# Patient Record
Sex: Female | Born: 1990 | Race: White | Hispanic: No | Marital: Single | Smoking: Never smoker
Health system: Southern US, Community
[De-identification: ages and names within clinical notes are randomized; demographics above are authoritative.]

## PROBLEM LIST (undated history)

## (undated) DIAGNOSIS — L709 Acne, unspecified: Secondary | ICD-10-CM

## (undated) DIAGNOSIS — Z8659 Personal history of other mental and behavioral disorders: Secondary | ICD-10-CM

## (undated) HISTORY — DX: Personal history of other mental and behavioral disorders: Z86.59

## (undated) HISTORY — DX: Acne, unspecified: L70.9

---
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
## 2012-06-23 ENCOUNTER — Ambulatory Visit (INDEPENDENT_AMBULATORY_CARE_PROVIDER_SITE_OTHER): Payer: BC Managed Care – PPO | Admitting: Internal Medicine

## 2012-06-23 ENCOUNTER — Encounter: Payer: Self-pay | Admitting: Internal Medicine

## 2012-06-23 VITALS — BP 110/72 | HR 69 | Temp 97.9°F | Resp 16 | Wt 126.5 lb

## 2012-06-23 DIAGNOSIS — J101 Influenza due to other identified influenza virus with other respiratory manifestations: Secondary | ICD-10-CM

## 2012-06-23 DIAGNOSIS — Z23 Encounter for immunization: Secondary | ICD-10-CM

## 2012-06-23 DIAGNOSIS — J111 Influenza due to unidentified influenza virus with other respiratory manifestations: Secondary | ICD-10-CM

## 2012-06-23 MED ORDER — CITALOPRAM HYDROBROMIDE 10 MG PO TABS: 10.0000 mg | ORAL_TABLET | Freq: Every day | ORAL | Status: DC

## 2012-06-23 MED ORDER — CIPROFLOXACIN HCL 250 MG PO TABS: 250.0000 mg | ORAL_TABLET | Freq: Two times a day (BID) | ORAL | Status: AC

## 2012-06-23 MED ORDER — PNEUMOCOCCAL VAC POLYVALENT 25 MCG/0.5ML IJ INJ: 0.5000 mL | INJECTION | Freq: Once | INTRAMUSCULAR | Status: AC

## 2012-06-23 MED ORDER — ALPRAZOLAM 0.5 MG PO TABS: 0.5000 mg | ORAL_TABLET | Freq: Every evening | ORAL | Status: DC | PRN

## 2012-06-23 NOTE — Progress Notes (Signed)
Patient ID: AIBHLINN KALMAR, female   DOB: 10-02-1990, 22 y.o.   MRN: [id number]  Patient Active Problem List  Diagnosis  . History of eating disorder  . Acne  . Generalized anxiety disorder  . Attention and concentration deficit  . History of nephrolithiasis  . Need for pneumococcal vaccine  . Influenza A with respiratory manifestations    Subjective:  CC:   Chief Complaint  Patient presents with  . Follow-up    Med refills     HPI:   Brianna Morales a 22 y.o. female who presents with a Request for  Vaccinations prior to going abroad.  She is travelling to [place] on Jan 30 and staying until  May 8 , [place] as part of her [employer] study program.  She was diagnosed with Influenza A  over Christmas, followed by Strep throat .  Feels fine today.  Has not had the flu or pneumonia vaccine.    Past Medical History  Diagnosis Date  . History of eating disorder   . Acne     History reviewed. No pertinent past surgical history.     . pneumococcal 23 valent vaccine  0.5 mL Intramuscular Once     The following portions of the patient's history were reviewed and updated as appropriate: Allergies, current medications, and problem list.    Review of Systems:   12 Pt  review of systems was negative except those addressed in the HPI,     History   Social History  . Marital Status: Single    Spouse Name: N/A    Number of Children: N/A  . Years of Education: N/A   Occupational History  . Not on file.   Social History Main Topics  . Smoking status: Never Smoker   . Smokeless tobacco: Never Used  . Alcohol Use: No  . Drug Use: No  . Sexually Active: Not on file   Other Topics Concern  . Not on file   Social History Narrative  . No narrative on file    Objective:  BP 110/72  Pulse 69  Temp 97.9 F (36.6 C) (Oral)  Resp 16  Wt 126 lb 8 oz (57.38 kg)  SpO2 98%  LMP 06/09/2012  General appearance: alert, cooperative and appears stated  age Ears: normal TM's and external ear canals both ears Throat: lips, mucosa, and tongue normal; teeth and gums normal Neck: no adenopathy, no carotid bruit, supple, symmetrical, trachea midline and thyroid not enlarged, symmetric, no tenderness/mass/nodules Back: symmetric, no curvature. ROM normal. No CVA tenderness. Lungs: clear to auscultation bilaterally Heart: regular rate and rhythm, S1, S2 normal, no murmur, click, rub or gallop Abdomen: soft, non-tender; bowel sounds normal; no masses,  no organomegaly Pulses: 2+ and symmetric Skin: Skin color, texture, turgor normal. No rashes or lesions Lymph nodes: Cervical, supraclavicular, and axillary nodes normal.  Assessment and Plan:  Need for pneumococcal vaccine Pneumovax given,   Influenza A with respiratory manifestations Influenza vaccine given today.   Updated Medication List Outpatient Encounter Prescriptions as of 06/23/2012  Medication Sig Dispense Refill  . ALPRAZolam (XANAX) 0.5 MG tablet Take 1 tablet (0.5 mg total) by mouth at bedtime as needed for sleep.  30 tablet  0  . citalopram (CELEXA) 10 MG tablet Take 1 tablet (10 mg total) by mouth daily.  30 tablet  0  . [DISCONTINUED] ALPRAZolam (XANAX) 0.5 MG tablet take 1 tablet by mouth once daily if needed for anxiety  20 tablet  0  . [  DISCONTINUED] ALPRAZolam (XANAX) 0.5 MG tablet Take 1 tablet (0.5 mg total) by mouth at bedtime as needed for sleep.  90 tablet  0  . [DISCONTINUED] citalopram (CELEXA) 10 MG tablet Take 1 tablet (10 mg total) by mouth daily.  30 tablet  5  . [DISCONTINUED] citalopram (CELEXA) 10 MG tablet Take 1 tablet (10 mg total) by mouth daily.  90 tablet  1  . ciprofloxacin (CIPRO) 250 MG tablet Take 1 tablet (250 mg total) by mouth 2 (two) times daily.  6 tablet  0   Facility-Administered Encounter Medications as of 06/23/2012  Medication Dose Route Frequency Provider Last Rate Last Dose  . pneumococcal 23 valent vaccine (PNU-IMMUNE) injection 0.5 mL   0.5 mL Intramuscular Once Sherlene Shams, MD         No orders of the defined types were placed in this encounter.    No Follow-up on file.

## 2012-06-25 ENCOUNTER — Encounter: Payer: Self-pay | Admitting: Internal Medicine

## 2012-06-25 DIAGNOSIS — Z23 Encounter for immunization: Secondary | ICD-10-CM | POA: Insufficient documentation

## 2012-06-25 DIAGNOSIS — J101 Influenza due to other identified influenza virus with other respiratory manifestations: Secondary | ICD-10-CM | POA: Insufficient documentation

## 2012-06-25 NOTE — Assessment & Plan Note (Signed)
Pneumovax given,

## 2012-06-25 NOTE — Assessment & Plan Note (Signed)
-   Influenza vaccine given today 

## 2012-10-16 ENCOUNTER — Other Ambulatory Visit: Payer: Self-pay | Admitting: Internal Medicine

## 2012-10-17 NOTE — Telephone Encounter (Signed)
Refill one time only   Will need OV prior to additional refills on alprazolam

## 2012-10-17 NOTE — Telephone Encounter (Signed)
Left message for patient to call office. Refill called in as requested.

## 2012-12-01 ENCOUNTER — Ambulatory Visit (INDEPENDENT_AMBULATORY_CARE_PROVIDER_SITE_OTHER): Payer: Self-pay | Admitting: Internal Medicine

## 2012-12-01 ENCOUNTER — Encounter: Payer: Self-pay | Admitting: Internal Medicine

## 2012-12-01 VITALS — BP 106/64 | HR 59 | Temp 97.4°F | Resp 16 | Wt 133.8 lb

## 2012-12-01 DIAGNOSIS — Z79899 Other long term (current) drug therapy: Secondary | ICD-10-CM

## 2012-12-01 DIAGNOSIS — F411 Generalized anxiety disorder: Secondary | ICD-10-CM

## 2012-12-01 DIAGNOSIS — R5383 Other fatigue: Secondary | ICD-10-CM

## 2012-12-01 DIAGNOSIS — R41840 Attention and concentration deficit: Secondary | ICD-10-CM

## 2012-12-01 DIAGNOSIS — Z309 Encounter for contraceptive management, unspecified: Secondary | ICD-10-CM

## 2012-12-01 DIAGNOSIS — R5381 Other malaise: Secondary | ICD-10-CM

## 2012-12-01 LAB — COMPREHENSIVE METABOLIC PANEL
ALT: 16 U/L (ref 0–35)
AST: 20 U/L (ref 0–37)
Alkaline Phosphatase: 58 U/L (ref 39–117)
CO2: 24 mEq/L (ref 19–32)
Sodium: 136 mEq/L (ref 135–145)
Total Bilirubin: 0.5 mg/dL (ref 0.3–1.2)
Total Protein: 7.2 g/dL (ref 6.0–8.3)

## 2012-12-01 LAB — CBC WITH DIFFERENTIAL/PLATELET
Basophils Absolute: 0 10*3/uL (ref 0.0–0.1)
Basophils Relative: 1 % (ref 0–1)
Eosinophils Absolute: 0.2 10*3/uL (ref 0.0–0.7)
Eosinophils Relative: 2 % (ref 0–5)
HCT: 38.4 % (ref 36.0–46.0)
MCH: 28.2 pg (ref 26.0–34.0)
MCHC: 34.9 g/dL (ref 30.0–36.0)
MCV: 80.7 fL (ref 78.0–100.0)
Monocytes Absolute: 1 10*3/uL (ref 0.1–1.0)
Platelets: 286 10*3/uL (ref 150–400)
RDW: 14.4 % (ref 11.5–15.5)
WBC: 8.1 10*3/uL (ref 4.0–10.5)

## 2012-12-01 LAB — FERRITIN: Ferritin: 16 ng/mL (ref 10–291)

## 2012-12-01 LAB — TSH: TSH: 0.698 u[IU]/mL (ref 0.350–4.500)

## 2012-12-01 MED ORDER — CITALOPRAM HYDROBROMIDE 20 MG PO TABS: 20.0000 mg | ORAL_TABLET | Freq: Every day | ORAL | Status: DC

## 2012-12-01 MED ORDER — ALPRAZOLAM 1 MG PO TABS: 1.0000 mg | ORAL_TABLET | Freq: Every day | ORAL | Status: DC | PRN

## 2012-12-01 MED ORDER — CITALOPRAM HYDROBROMIDE 20 MG PO TABS: 10.0000 mg | ORAL_TABLET | Freq: Every day | ORAL | Status: DC

## 2012-12-01 NOTE — Patient Instructions (Addendum)
Referral to Dr Tamsen Roers for evaluation for ADD and mood cycling   I have increased your citalopram to 20 mg daily      Take no more than 1 mg of alprazolam as needed for panic   Birth control!!!

## 2012-12-01 NOTE — Progress Notes (Signed)
Patient ID: Brianna Morales, female   DOB: [birth year], 22 y.o.   MRN: [id number]  Patient Active Problem List   Diagnosis Date Noted  . Contraception management 12/02/2012  . Need for pneumococcal vaccine 06/25/2012  . Influenza A with respiratory manifestations 06/25/2012  . History of nephrolithiasis 12/19/2011  . Attention and concentration deficit 09/12/2011  . Generalized anxiety disorder 08/13/2011  . History of eating disorder   . Acne     Subjective:  CC:   Chief Complaint  Patient presents with  . Follow-up    update medications    HPI:   Brianna Morales a 22 y.o. female who presents for follow up on chronic conditions including anxiety with occasional panic episodes,  Irregular menstrual periods,  And trouble concentrating. 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  No trauma or incidents .    Has talked to several of her friends about her anxeity and trouble concentrating and now wants to have medications adjusted and evaluation for ADD  States that she did not take her citalopram for several weeks because she forgot, and developed anhedonia and tearfulness, so she has resumed it with good results.  She continues to have occasional "down" days lasting 2 or 3 days when she finds she just wants to lie in bed all day.   Wants to increase dose .  Uses the alprazolam for panic  Has found that if she uses it to control mild anxiety she feels lethargic.   Wants to start oral contraceptives to regulate her periods.    Past Medical History  Diagnosis Date  . History of eating disorder   . Acne     History reviewed. No pertinent past surgical history.  . pneumococcal 23 valent vaccine  0.5 mL Intramuscular Once     The following portions of the patient's history were reviewed and updated as appropriate: Allergies, current medications, and problem list.    Review of Systems:   12 Pt  review of systems  was negative except those addressed in the HPI,     History   Social History  . Marital Status: Single    Spouse Name: N/A    Number of Children: N/A  . Years of Education: N/A   Occupational History  . Not on file.   Social History Main Topics  . Smoking status: Never Smoker   . Smokeless tobacco: Never Used  . Alcohol Use: No  . Drug Use: No  . Sexually Active: Not on file   Other Topics Concern  . Not on file   Social History Narrative  . No narrative on file    Objective:  BP 106/64  Pulse 59  Temp(Src) 97.4 F (36.3 C) (Oral)  Resp 16  Wt 133 lb 12 oz (60.669 kg)  BMI 22.95 kg/m2  SpO2 98%  LMP 11/07/2012  General appearance: alert, cooperative and appears stated age Ears: normal TM's and external ear canals both ears Throat: lips, mucosa, and tongue normal; teeth and gums normal Neck: no adenopathy, no carotid bruit, supple, symmetrical, trachea midline and thyroid not enlarged, symmetric, no tenderness/mass/nodules Back: symmetric, no curvature. ROM normal. No CVA tenderness. Lungs: clear to auscultation bilaterally Heart: regular rate and rhythm, S1, S2 normal, no murmur, click, rub or gallop Abdomen: soft, non-tender; bowel sounds normal; no masses,  no organomegaly Pulses: 2+ and symmetric Skin: Skin color, texture, turgor normal. No rashes or lesions  Lymph nodes: Cervical, supraclavicular, and axillary nodes normal.  Assessment and Plan:  Attention and concentration deficit Referral to Dr. Maryruth Bun for formal evaluation   Generalized anxiety disorder Discussed increasing citalpram to 20 mg daiy.  Warned her to avoid excessive reliance on sedating medications to manage her anxiety and recommended regular exercise and meditation.   Contraception management Education given regarding options for contraception, including oral contraceptives.  UPT and CMET ordered prior to initiation    Updated Medication List Outpatient Encounter Prescriptions as  of 12/01/2012  Medication Sig Dispense Refill  . ALPRAZolam (XANAX) 1 MG tablet Take 1 tablet (1 mg total) by mouth daily as needed for anxiety.  30 tablet  1  . citalopram (CELEXA) 20 MG tablet Take 1 tablet (20 mg total) by mouth daily.  30 tablet  1  . [DISCONTINUED] ALPRAZolam (XANAX) 0.5 MG tablet take 1 tablet by mouth at bedtime if needed for sleep  90 tablet  0  . [DISCONTINUED] citalopram (CELEXA) 10 MG tablet Take 1 tablet (10 mg total) by mouth daily.  30 tablet  0  . [DISCONTINUED] citalopram (CELEXA) 20 MG tablet Take 0.5 tablets (10 mg total) by mouth daily.  30 tablet  1  . ciprofloxacin (CIPRO) 250 MG tablet Take 1 tablet (250 mg total) by mouth 2 (two) times daily.  6 tablet  0  . norgestrel-ethinyl estradiol (OGESTREL) 0.5-50 MG-MCG tablet Take 1 tablet by mouth daily.  1 Package  11   Facility-Administered Encounter Medications as of 12/01/2012  Medication Dose Route Frequency Provider Last Rate Last Dose  . pneumococcal 23 valent vaccine (PNU-IMMUNE) injection 0.5 mL  0.5 mL Intramuscular Once Sherlene Shams, MD         Orders Placed This Encounter  Procedures  . TSH  . Comp Met (CMET)  . CBC w/Diff  . Iron Binding Cap (TIBC)  . Ferritin  . POCT urine pregnancy    No Follow-up on file.

## 2012-12-02 ENCOUNTER — Encounter: Payer: Self-pay | Admitting: Internal Medicine

## 2012-12-02 DIAGNOSIS — Z309 Encounter for contraceptive management, unspecified: Secondary | ICD-10-CM | POA: Insufficient documentation

## 2012-12-02 MED ORDER — NORGESTREL-ETHINYL ESTRADIOL 0.5-50 MG-MCG PO TABS: 1.0000 | ORAL_TABLET | Freq: Every day | ORAL | Status: DC

## 2012-12-02 NOTE — Assessment & Plan Note (Signed)
Education given regarding options for contraception, including oral contraceptives.  UPT and CMET ordered prior to initiation

## 2012-12-02 NOTE — Assessment & Plan Note (Signed)
Referral to Dr. Maryruth Bun for formal evaluation

## 2012-12-02 NOTE — Assessment & Plan Note (Signed)
Discussed increasing citalpram to 20 mg daiy.  Warned her to avoid excessive reliance on sedating medications to manage her anxiety and recommended regular exercise and meditation.

## 2012-12-04 ENCOUNTER — Encounter: Payer: Self-pay | Admitting: *Deleted

## 2012-12-12 ENCOUNTER — Telehealth: Payer: Self-pay | Admitting: Internal Medicine

## 2012-12-12 DIAGNOSIS — R41840 Attention and concentration deficit: Secondary | ICD-10-CM

## 2012-12-12 DIAGNOSIS — F419 Anxiety disorder, unspecified: Secondary | ICD-10-CM

## 2012-12-12 NOTE — Telephone Encounter (Signed)
Patient called wanting to know about a referral to a psychologist. Please advise.

## 2012-12-12 NOTE — Telephone Encounter (Signed)
Referral is in process to Dr. Maryruth Bun as requested

## 2012-12-13 ENCOUNTER — Telehealth: Payer: Self-pay | Admitting: Internal Medicine

## 2012-12-13 NOTE — Telephone Encounter (Signed)
Pt would like a call back to know which Birth Control she would be perscribed. She received her blood work back that she is not pregnant and wanted to get the birth control. She uses [facility] in [place] ??

## 2012-12-13 NOTE — Telephone Encounter (Signed)
Pt notified referral in process

## 2012-12-14 ENCOUNTER — Telehealth: Payer: Self-pay | Admitting: Internal Medicine

## 2012-12-14 NOTE — Telephone Encounter (Signed)
script was sent electronically on 12/02/12 and is at pharmacy notified patient.

## 2012-12-14 NOTE — Telephone Encounter (Signed)
Notified patient script at pharmacy. 

## 2012-12-14 NOTE — Telephone Encounter (Signed)
Patient called back checking on a script for birth control. Please advise

## 2013-01-11 ENCOUNTER — Telehealth: Payer: Self-pay | Admitting: *Deleted

## 2013-01-11 NOTE — Telephone Encounter (Signed)
If it looks like a pimple, try treating it like a pimple or make an appt

## 2013-01-11 NOTE — Telephone Encounter (Signed)
Patient stated since starting new Birth control the OGESTREL she is having break out of a rash on her face an back that look like pimples please advise,

## 2013-01-12 NOTE — Telephone Encounter (Signed)
Tried to call patient voicemail has not been set up can't leave message.

## 2013-01-17 NOTE — Telephone Encounter (Signed)
Patient notified as instructed. 

## 2013-02-05 ENCOUNTER — Other Ambulatory Visit: Payer: Self-pay | Admitting: Internal Medicine

## 2013-02-07 NOTE — Telephone Encounter (Signed)
Notified pt that Rx ready for pick up and need to sign controlled substance agreement, verbalized understanding.

## 2013-02-27 ENCOUNTER — Encounter: Payer: Self-pay | Admitting: *Deleted

## 2013-03-01 ENCOUNTER — Encounter: Payer: Self-pay | Admitting: Internal Medicine

## 2013-03-01 ENCOUNTER — Ambulatory Visit (INDEPENDENT_AMBULATORY_CARE_PROVIDER_SITE_OTHER): Payer: Self-pay | Admitting: Internal Medicine

## 2013-03-01 VITALS — BP 100/60 | HR 54 | Temp 98.4°F | Resp 14 | Ht 64.0 in | Wt 135.8 lb

## 2013-03-01 DIAGNOSIS — F411 Generalized anxiety disorder: Secondary | ICD-10-CM

## 2013-03-01 DIAGNOSIS — Z309 Encounter for contraceptive management, unspecified: Secondary | ICD-10-CM

## 2013-03-01 DIAGNOSIS — R41840 Attention and concentration deficit: Secondary | ICD-10-CM

## 2013-03-01 MED ORDER — IBUPROFEN 800 MG PO TABS: 800.0000 mg | ORAL_TABLET | Freq: Three times a day (TID) | ORAL | Status: AC | PRN

## 2013-03-01 MED ORDER — NORETHINDRONE ACET-ETHINYL EST 1-20 MG-MCG PO TABS: 1.0000 | ORAL_TABLET | Freq: Every day | ORAL | Status: DC

## 2013-03-01 NOTE — Progress Notes (Signed)
Patient ID: Brianna Morales, female   DOB: 12-03-1990, 22 y.o.   MRN: [id number]  Patient Active Problem List   Diagnosis Date Noted  . Contraception management 12/02/2012  . Need for pneumococcal vaccine 06/25/2012  . Influenza A with respiratory manifestations 06/25/2012  . History of nephrolithiasis 12/19/2011  . Attention and concentration deficit 09/12/2011  . Generalized anxiety disorder 08/13/2011  . History of eating disorder   . Acne     Subjective:  CC:   Chief Complaint  Patient presents with  . Follow-up    discuss birthcontrol    HPI:   [name] a 22 y.o. female who presents for follow up on chronic conditions including anxiety with occasional panic episodes,  Irregular menstrual periods,  And trouble concentrating.  At last visit had resumed celexa and was using alprazolam prn .  She was referred to Dr. Maryruth Bun due to concurrent symptoms of ADD for diagnostic testing.  Her citalopram was  increased to 30 mg qd by Dr Maryruth Bun august 4th and she was referred to Dr Dionne Milo for formal testing for ADD.Her appt is today .  She was advised to stop using marijuana. Which was using recreational he, to avoid psychological dependence.  She states that she has had no trouble staining from use of marijuana. She does not use other illicits.  She is requesting a change in hormonal contraceptives because she has noticed that her admitting increases the week after her period in a week during her period.   she uses [employer] ,  showers twice daily  when she is physically active.   also low-fat diet. Chest is she also has moderately painful menstrual cramps that last 3-4 days prior to her menstrual period occurred again at the onset of menses. She has not tried taking any medications on regular basis but has had relief with ibuprofen  and Midol      Past Medical History  Diagnosis Date  . History of eating disorder   . Acne     History reviewed. No  pertinent past surgical history.  . pneumococcal 23 valent vaccine  0.5 mL Intramuscular Once     The following portions of the patient's history were reviewed and updated as appropriate: Allergies, current medications, and problem list.    Review of Systems:  Patient denies headache, fevers, malaise, unintentional weight loss, skin rash, eye pain, sinus congestion and sinus pain, sore throat, dysphagia,  hemoptysis , cough, dyspnea, wheezing, chest pain, palpitations, orthopnea, edema, abdominal pain, nausea, melena, diarrhea, constipation, flank pain, dysuria, hematuria, urinary  Frequency, nocturia, numbness, tingling, seizures,  Focal weakness, Loss of consciousness,  Tremor, insomnia, depression, anxiety, and suicidal ideation.     History   Social History  . Marital Status: Single    Spouse Name: N/A    Number of Children: N/A  . Years of Education: N/A   Occupational History  . Not on file.   Social History Main Topics  . Smoking status: Never Smoker   . Smokeless tobacco: Never Used  . Alcohol Use: No     Comment: has quit since August 2014  . Drug Use: Yes    Special: Marijuana  . Sexual Activity: Yes   Other Topics Concern  . Not on file   Social History Narrative  . No narrative on file    Objective:  Filed Vitals:   03/01/13 0915  BP: 100/60  Pulse: 54  Temp: 98.4 F (36.9 C)  Resp:  14     General appearance: alert, cooperative and appears stated age Neck: no adenopathy, no carotid bruit, supple, symmetrical, trachea midline and thyroid not enlarged, symmetric, no tenderness/mass/nodules Back: symmetric, no curvature. ROM normal. No CVA tenderness. Lungs: clear to auscultation bilaterally Heart: regular rate and rhythm, S1, S2 normal, no murmur, click, rub or gallop Abdomen: soft, non-tender; bowel sounds normal; no masses,  no organomegaly Pulses: 2+ and symmetric Skin: Skin color, texture, turgor normal. No rashes or lesions Lymph nodes:  Cervical, supraclavicular, and axillary nodes normal. Psych: calm. makes good eye contact, affect normal. Speech is nonpressured . attention span appears normal.  Assessment and Plan:  Attention and concentration deficit Symptoms reported by patient. She has been referred for formal testing to Dr. Dionne Milo by Dr. Maryruth Bun  Contraception management Per patient request we have changed her hormal contraception.  Her new prescription has a  lower estrogen/higher norethindrone content.  Generalized anxiety disorder  Improved with increased dose of citalopram. Follow up with Dr. Maryruth Bun as needed.   Updated Medication List Outpatient Encounter Prescriptions as of 03/01/2013  Medication Sig Dispense Refill  . ALPRAZolam (XANAX) 0.5 MG tablet take 1 tablet by mouth at bedtime if needed for sleep  90 tablet  0  . ALPRAZolam (XANAX) 1 MG tablet Take 1 tablet (1 mg total) by mouth daily as needed for anxiety.  30 tablet  1  . citalopram (CELEXA) 20 MG tablet Take 30 mg by mouth daily.      . [DISCONTINUED] citalopram (CELEXA) 20 MG tablet Take 1 tablet (20 mg total) by mouth daily.  30 tablet  1  . [DISCONTINUED] norgestrel-ethinyl estradiol (OGESTREL) 0.5-50 MG-MCG tablet Take 1 tablet by mouth daily.  1 Package  11  . ciprofloxacin (CIPRO) 250 MG tablet Take 1 tablet (250 mg total) by mouth 2 (two) times daily.  6 tablet  0  . ibuprofen (ADVIL,MOTRIN) 800 MG tablet Take 1 tablet (800 mg total) by mouth every 8 (eight) hours as needed for pain.  30 tablet  0  . norethindrone-ethinyl estradiol (MICROGESTIN,JUNEL,LOESTRIN) 1-20 MG-MCG tablet Take 1 tablet by mouth daily.  1 Package  11   Facility-Administered Encounter Medications as of 03/01/2013  Medication Dose Route Frequency Provider Last Rate Last Dose  . pneumococcal 23 valent vaccine (PNU-IMMUNE) injection 0.5 mL  0.5 mL Intramuscular Once Sherlene Shams, MD         No orders of the defined types were placed in this encounter.    No  Follow-up on file.

## 2013-03-01 NOTE — Patient Instructions (Addendum)
I am changing your birth control to a combination that has less estrogen and more norethindrone to see if your acne breakouts impmorve  I sent an rx for 800 mg ibuprofen to pharmacy to use for bac cramps  Try adding 500 mg tylenol to the iburprofen if additional pain relief is needed

## 2013-03-03 ENCOUNTER — Encounter: Payer: Self-pay | Admitting: Internal Medicine

## 2013-03-03 NOTE — Assessment & Plan Note (Addendum)
Per patient request we have changed her hormal contraception.  Her new prescription has a  lower estrogen/higher norethindrone content.

## 2013-03-03 NOTE — Assessment & Plan Note (Signed)
Symptoms reported by patient. She has been referred for formal testing to Dr. Dionne Milo by Dr. Maryruth Bun

## 2013-03-03 NOTE — Assessment & Plan Note (Signed)
Improved with increased dose of citalopram. Follow up with Dr. Maryruth Bun as needed.

## 2013-05-15 ENCOUNTER — Other Ambulatory Visit: Payer: Self-pay | Admitting: Internal Medicine

## 2013-05-15 MED ORDER — ALPRAZOLAM 0.5 MG PO TABS: ORAL_TABLET | ORAL | Status: AC

## 2013-05-15 NOTE — Telephone Encounter (Signed)
Rx faxed to pharmacy  

## 2013-05-19 ENCOUNTER — Other Ambulatory Visit: Payer: Self-pay | Admitting: Internal Medicine

## 2013-05-21 ENCOUNTER — Other Ambulatory Visit: Payer: Self-pay | Admitting: Internal Medicine

## 2013-05-21 NOTE — Telephone Encounter (Signed)
Pt.notified

## 2013-05-21 NOTE — Telephone Encounter (Signed)
No, the alprazolam dose was changed ,  The 1 mg has been dc'd and all future refills should come from Dr Maryruth Bun. Please let patient know that we can't both be prescribing  alprazolam to her .

## 2013-05-21 NOTE — Telephone Encounter (Signed)
0.5 mg just refilled 05/15/13, refill the 1 mg?

## 2013-07-18 ENCOUNTER — Emergency Department: Payer: Self-pay | Admitting: Emergency Medicine

## 2013-07-18 LAB — URINALYSIS, COMPLETE
BILIRUBIN, UR: NEGATIVE
Bacteria: NONE SEEN
Glucose,UR: 50 mg/dL (ref 0–75)
KETONE: NEGATIVE
Leukocyte Esterase: NEGATIVE
Nitrite: NEGATIVE
PH: 6 (ref 4.5–8.0)
PROTEIN: NEGATIVE
RBC,UR: 754 /HPF (ref 0–5)
Specific Gravity: 1.024 (ref 1.003–1.030)
Squamous Epithelial: 3
WBC UR: NONE SEEN /HPF (ref 0–5)

## 2013-07-18 LAB — CBC
HCT: 43.6 % (ref 35.0–47.0)
HGB: 15 g/dL (ref 12.0–16.0)
MCH: 29.4 pg (ref 26.0–34.0)
MCHC: 34.4 g/dL (ref 32.0–36.0)
MCV: 85 fL (ref 80–100)
Platelet: 249 10*3/uL (ref 150–440)
RBC: 5.1 10*6/uL (ref 3.80–5.20)
RDW: 12.5 % (ref 11.5–14.5)
WBC: 11.5 10*3/uL — ABNORMAL HIGH (ref 3.6–11.0)

## 2013-07-18 LAB — COMPREHENSIVE METABOLIC PANEL
Albumin: 4 g/dL (ref 3.4–5.0)
Alkaline Phosphatase: 59 U/L
Anion Gap: 5 — ABNORMAL LOW (ref 7–16)
BILIRUBIN TOTAL: 0.4 mg/dL (ref 0.2–1.0)
BUN: 12 mg/dL (ref 7–18)
CREATININE: 0.68 mg/dL (ref 0.60–1.30)
Calcium, Total: 9.5 mg/dL (ref 8.5–10.1)
Chloride: 106 mmol/L (ref 98–107)
Co2: 27 mmol/L (ref 21–32)
EGFR (African American): >60
EGFR (Non-African Amer.): >60
Glucose: 77 mg/dL (ref 65–99)
Osmolality: 274 (ref 275–301)
POTASSIUM: 4 mmol/L (ref 3.5–5.1)
SGOT(AST): 19 U/L (ref 15–37)
SGPT (ALT): 20 U/L (ref 12–78)
Sodium: 138 mmol/L (ref 136–145)
Total Protein: 8 g/dL (ref 6.4–8.2)

## 2013-07-18 LAB — HCG, QUANTITATIVE, PREGNANCY: Beta Hcg, Quant.: <1 m[IU]/mL — ABNORMAL LOW

## 2013-09-09 IMAGING — US TRANSABDOMINAL ULTRASOUND OF PELVIS
1 series · 17 of 25 positions shown · non-contrast
Comparison: none

REASON FOR EXAM: Rt ovarian cyst  hematuria
COMMENTS:

[Series 1: transabdominal ultrasound of pelvis · 17 of 53 slices shown]
[im 1/53]
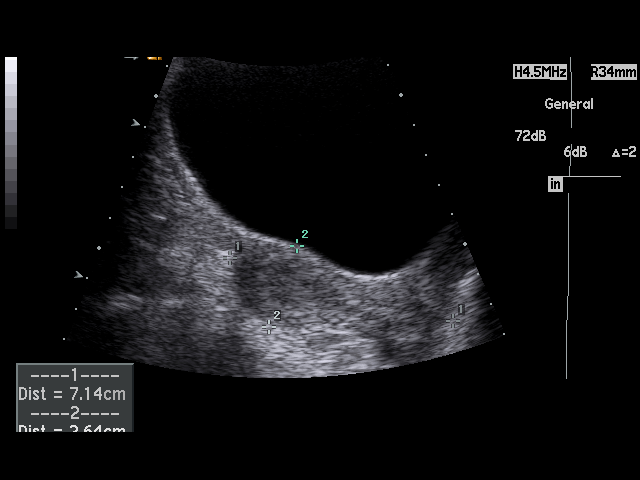
[im 5/53]
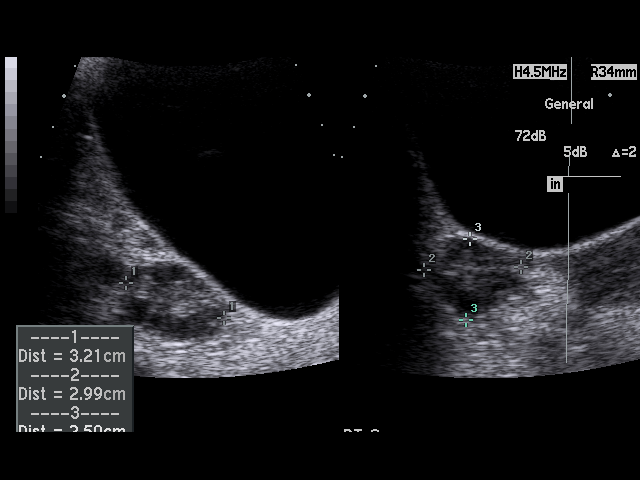
[im 7/53]
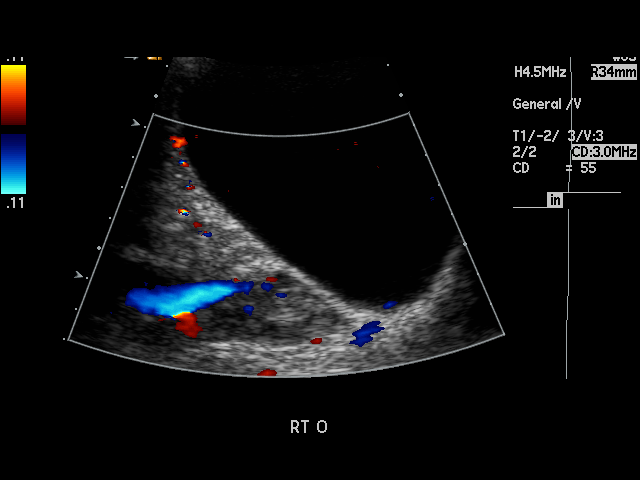
[im 11/53]
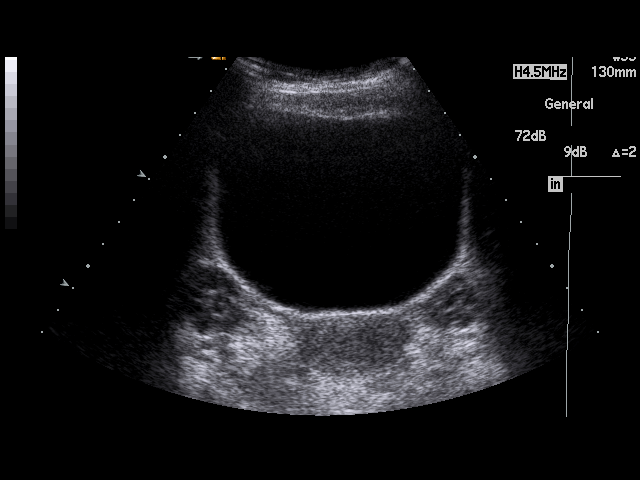
[im 14/53]
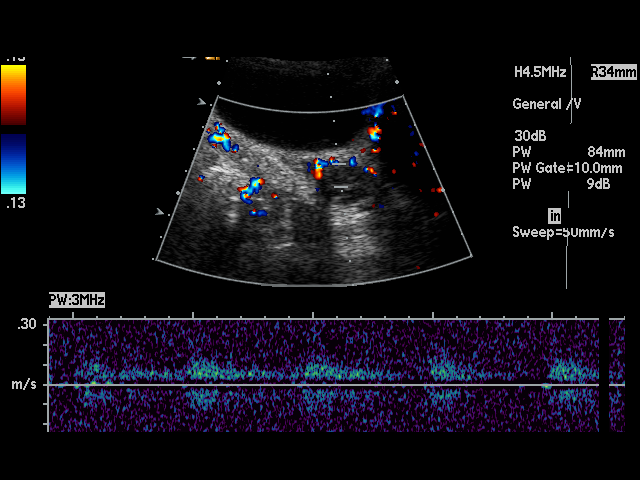
[im 18/53]
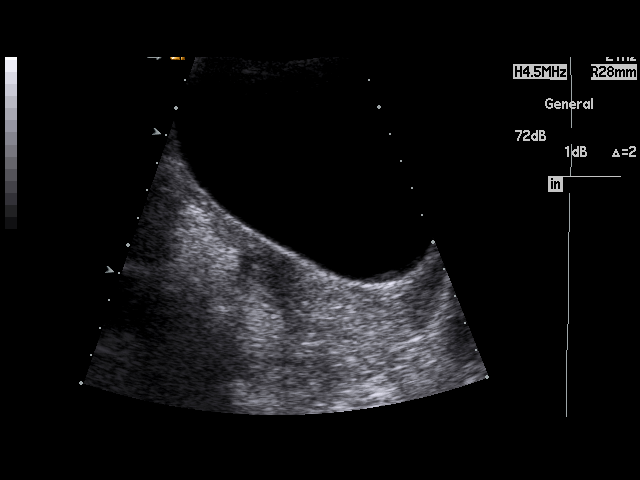
[im 20/53]
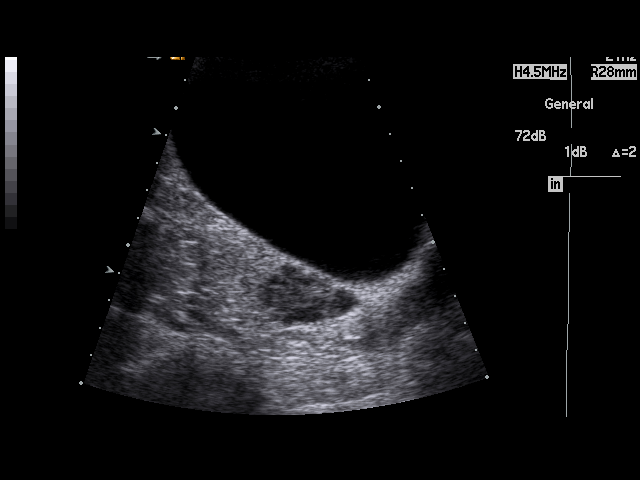
[im 24/53]
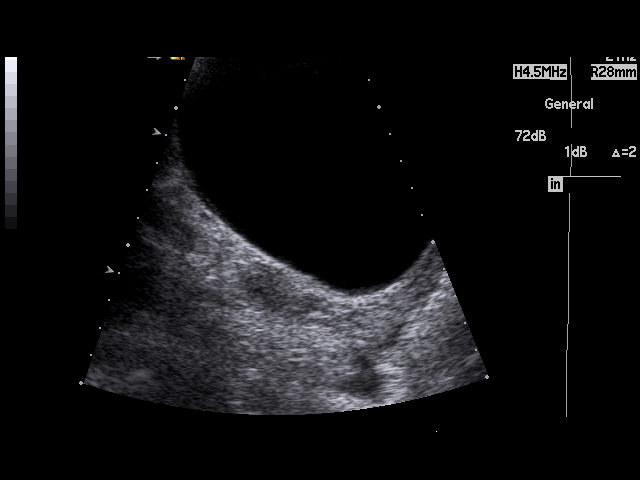
[im 27/53]
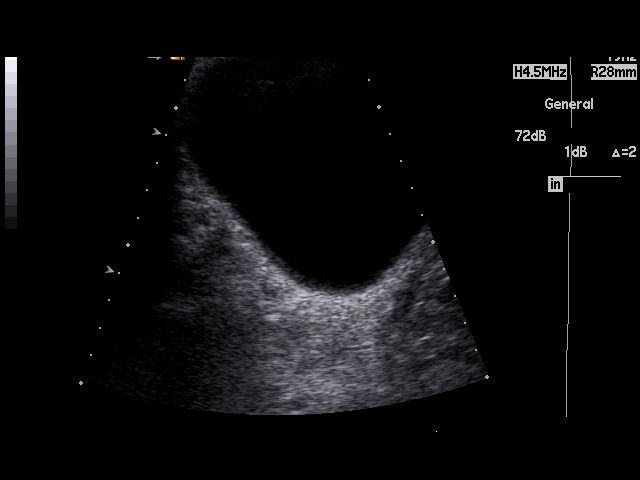
[im 29/53]
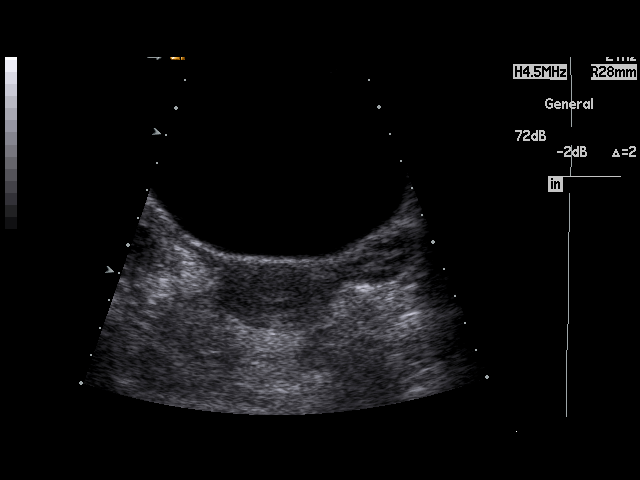
[im 33/53]
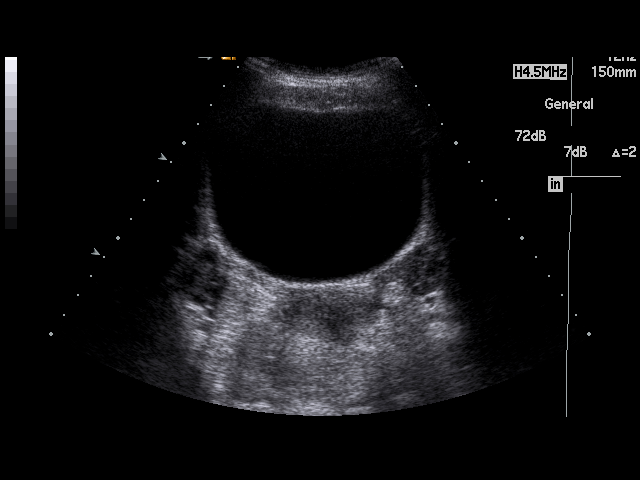
[im 35/53]
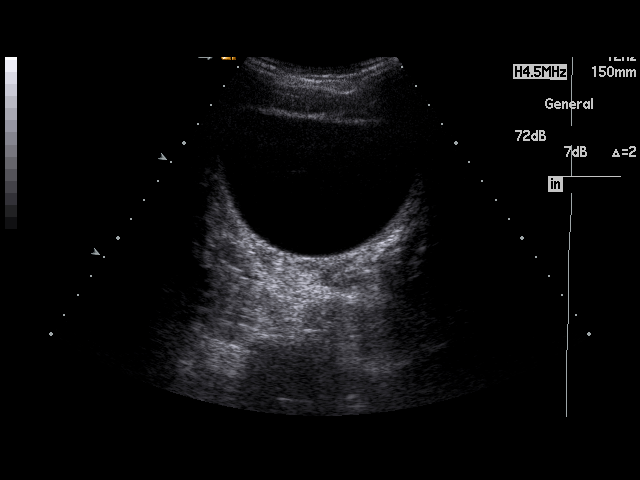
[im 40/53]
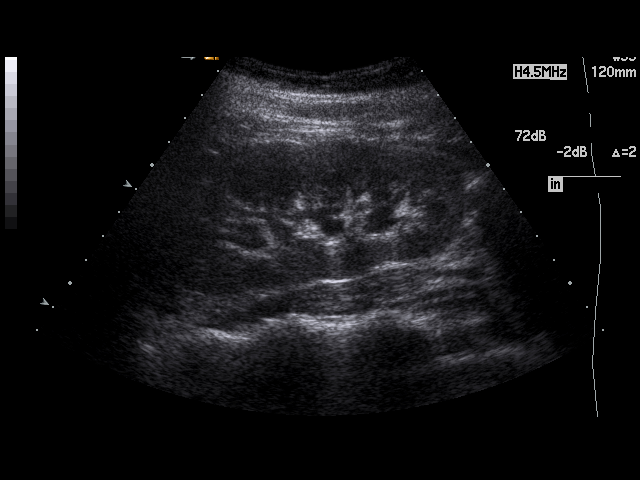
[im 42/53]
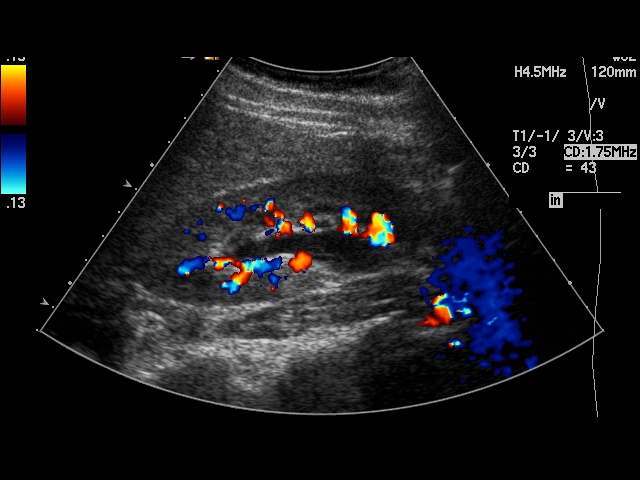
[im 46/53]
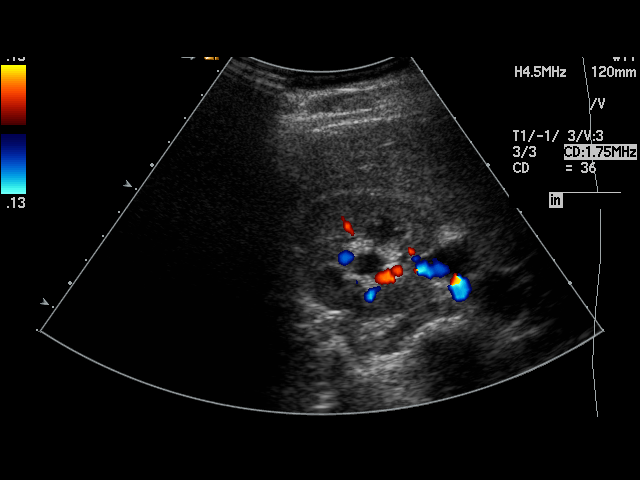
[im 48/53]
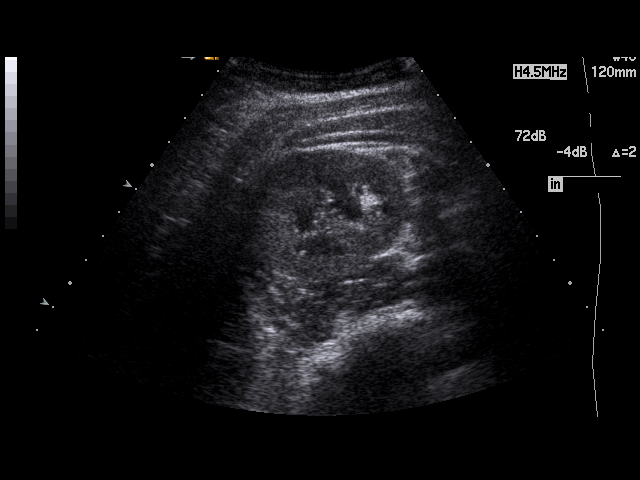
[im 53/53]
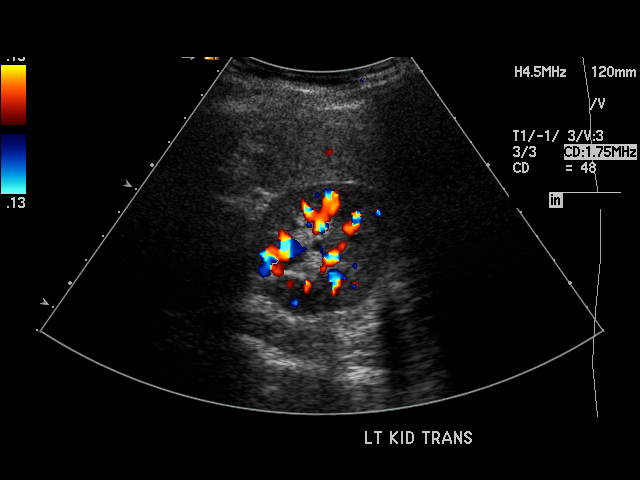

[17 of 25 positions shown; findings below may reference images not displayed]

PROCEDURE:     US  - US PELVIS EXAM  - October 06, 2011  [DATE]

RESULT:     Transabdominal pelvic ultrasound was performed. The uterus
measures 7.1 cm x 2.6 cm x 3.4 cm. No uterine mass is seen. The endometrium
measures 4.9 mm in thickness. The right and left ovaries are visualized. The
right ovary measures 3.2 cm at maximum diameter and the left ovary measures
3.6 cm at maximum diameter. Vascular flow is seen in each ovary. No abnormal
adnexal masses are seen. There is a nonspecific trace of free fluid in the
pelvis. The visualized portion of the urinary bladder is normal in
appearance. There is mild prominence of the right renal pelvis compatible
with mild right hydronephrosis or ectasia. The left kidney is normal in
appearance sonographically. The visualized portion of the urinary bladder
shows no significant abnormalities.
IMPRESSION: 1. There is mild prominence of the renal collecting system on the right
compatible with mild hydronephrosis or ectasia.
2. There is a nonspecific trace of free fluid in the cul-de-sac.
3. No abnormal adnexal masses are identified.
4. The uterus and ovaries reveal no significant abnormalities
sonographically.

## 2014-02-19 ENCOUNTER — Other Ambulatory Visit: Payer: Self-pay | Admitting: Internal Medicine

## 2014-02-19 NOTE — Telephone Encounter (Signed)
Last refill 8.12.15, last OV 9.25.14.  Please advise refill

## 2014-02-22 NOTE — Telephone Encounter (Signed)
I do not withhold birth control .  Please fill for one month and ask patient to make appt ASAP

## 2014-08-15 ENCOUNTER — Ambulatory Visit: Payer: Self-pay | Admitting: Nurse Practitioner

## 2015-06-22 IMAGING — US US RENAL KIDNEY
1 series · 14 of 25 positions shown · non-contrast
Comparison: 10/06/2011

CLINICAL DATA: Right-sided flank pain.

EXAM:
RENAL/URINARY TRACT ULTRASOUND COMPLETE

[Series 1: us renal kidney · 0.23mm/px · 14 of 48 slices shown]
[im 1/48]
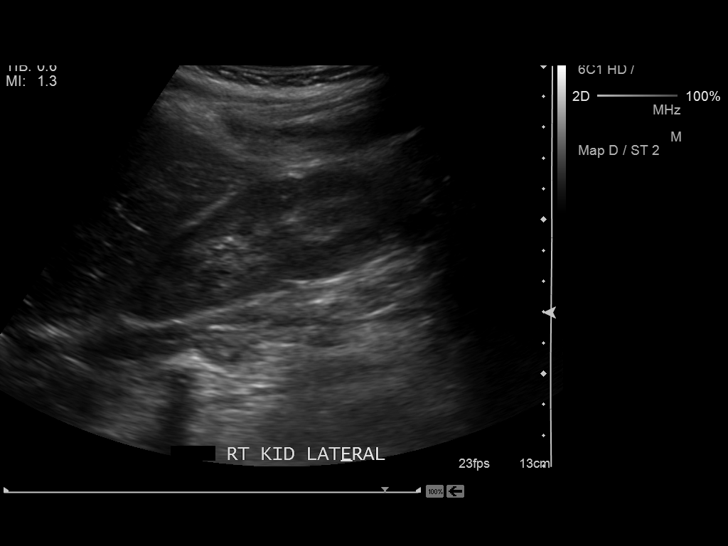
[im 4/48]
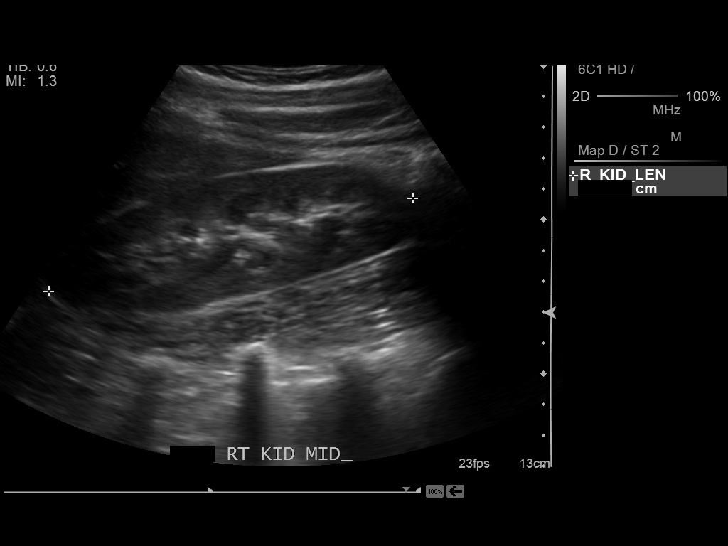
[im 8/48]
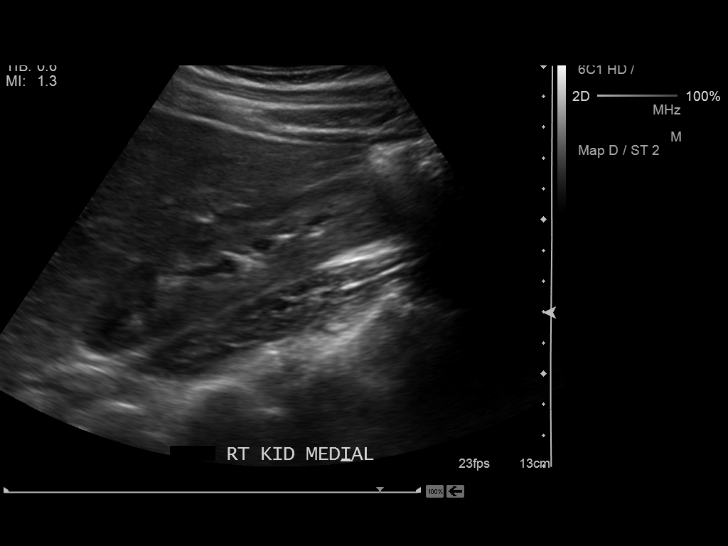
[im 12/48]
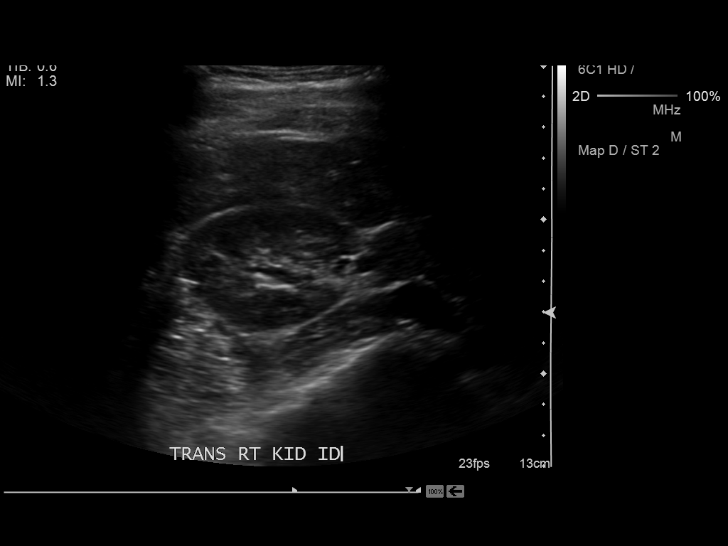
[im 16/48]
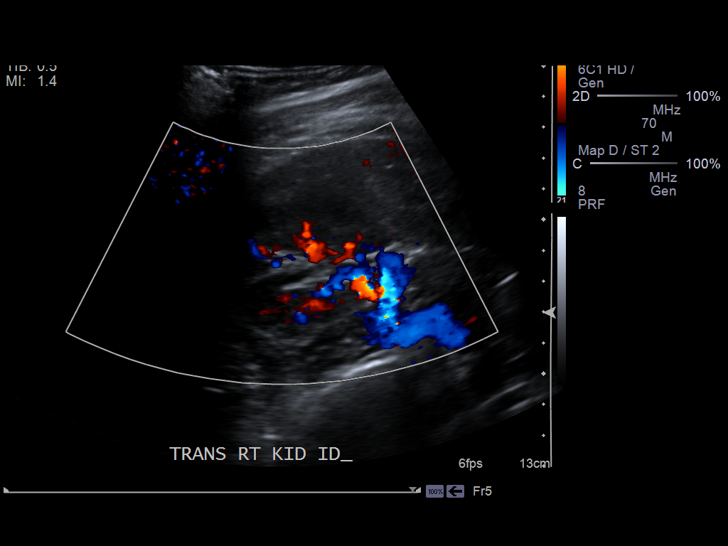
[im 18/48]
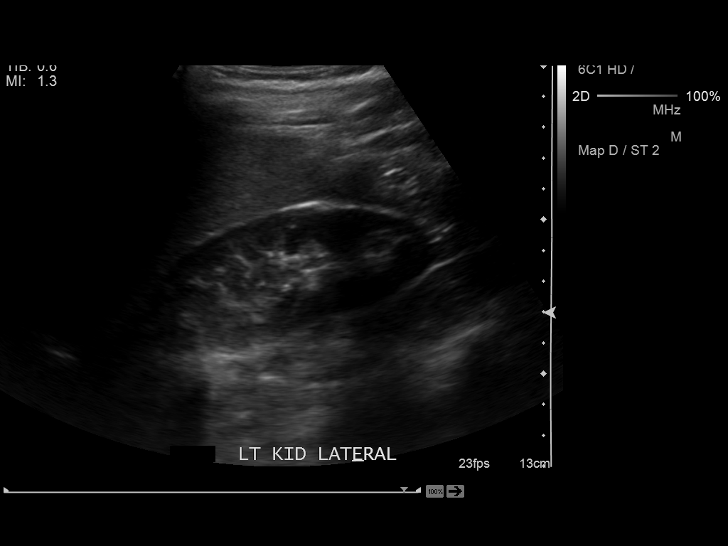
[im 22/48]
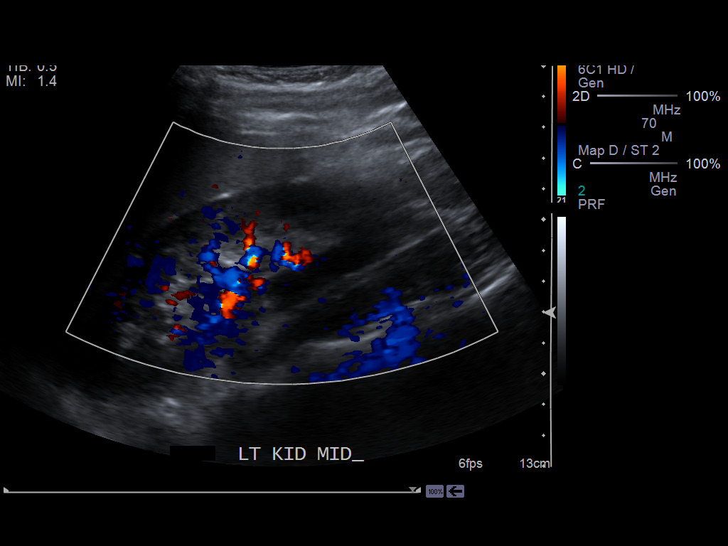
[im 26/48]
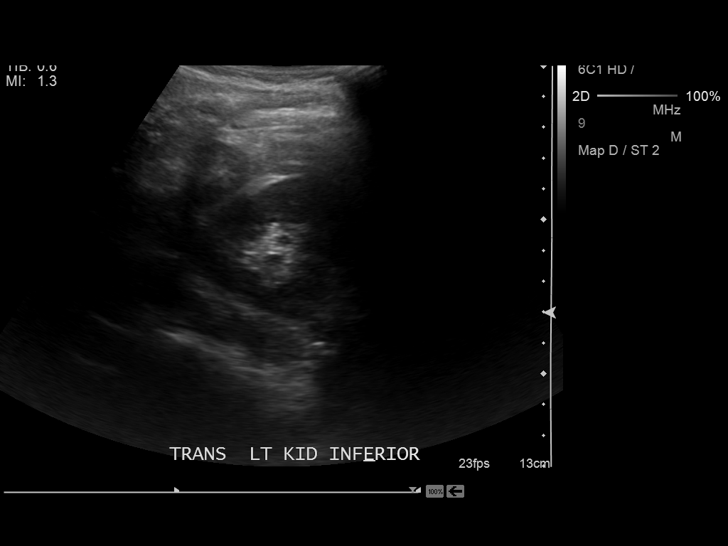
[im 30/48]
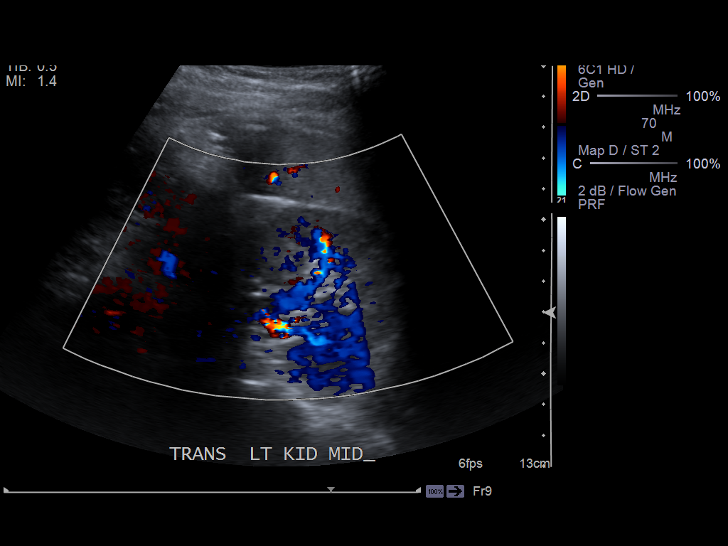
[im 32/48]
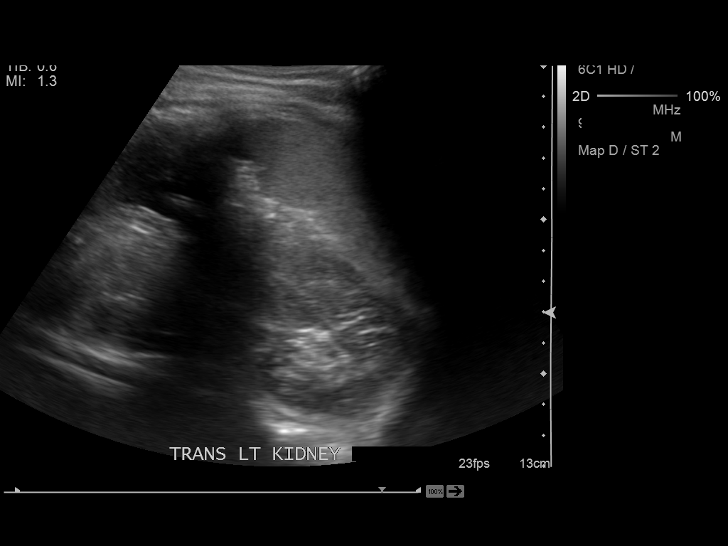
[im 36/48]
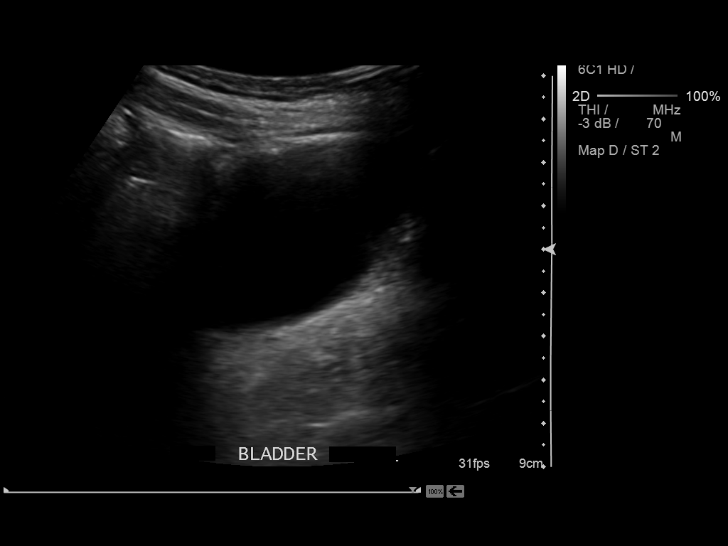
[im 40/48]
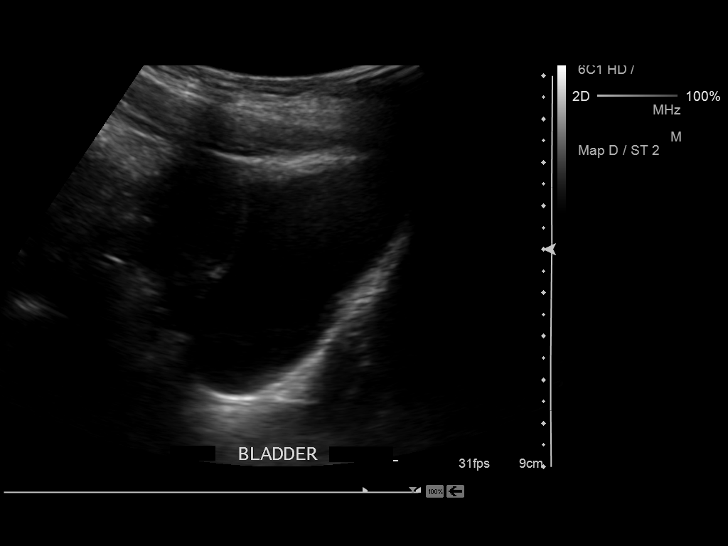
[im 44/48]
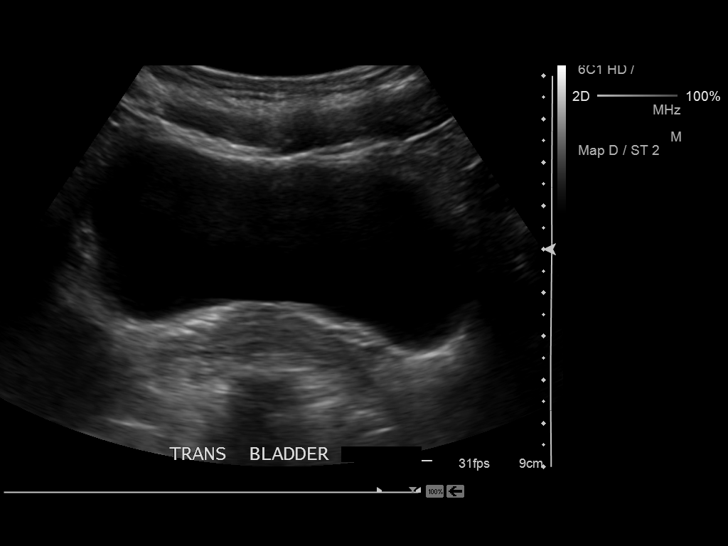
[im 48/48]
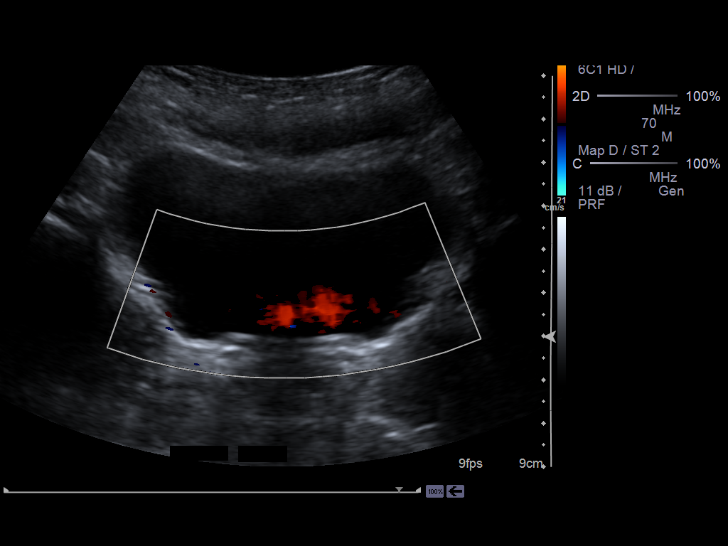

[14 of 25 positions shown; findings below may reference images not displayed]

FINDINGS: Right Kidney:

Length: 12.2 cm. Echogenicity within normal limits. No mass or
hydronephrosis visualized.

Left Kidney:

Length: 12.2 cm. Echogenicity within normal limits. No mass or
hydronephrosis visualized.

Bladder:

Appears normal for degree of bladder distention. Bilateral ureteral
jets demonstrated.
IMPRESSION: Normal renal ultrasound.

## 2016-07-19 IMAGING — CR DG CHEST 2V
1 series · 2 of 2 positions shown · non-contrast
Comparison: None.

CLINICAL DATA: Patient diagnosed with pneumonia in June 2014.
Continued sternal chest pain.

EXAM:
CHEST  2 VIEW

[Series 2: w chest lat · 0.14mm/px · 2 of 2 slices shown]
[im 1/2]
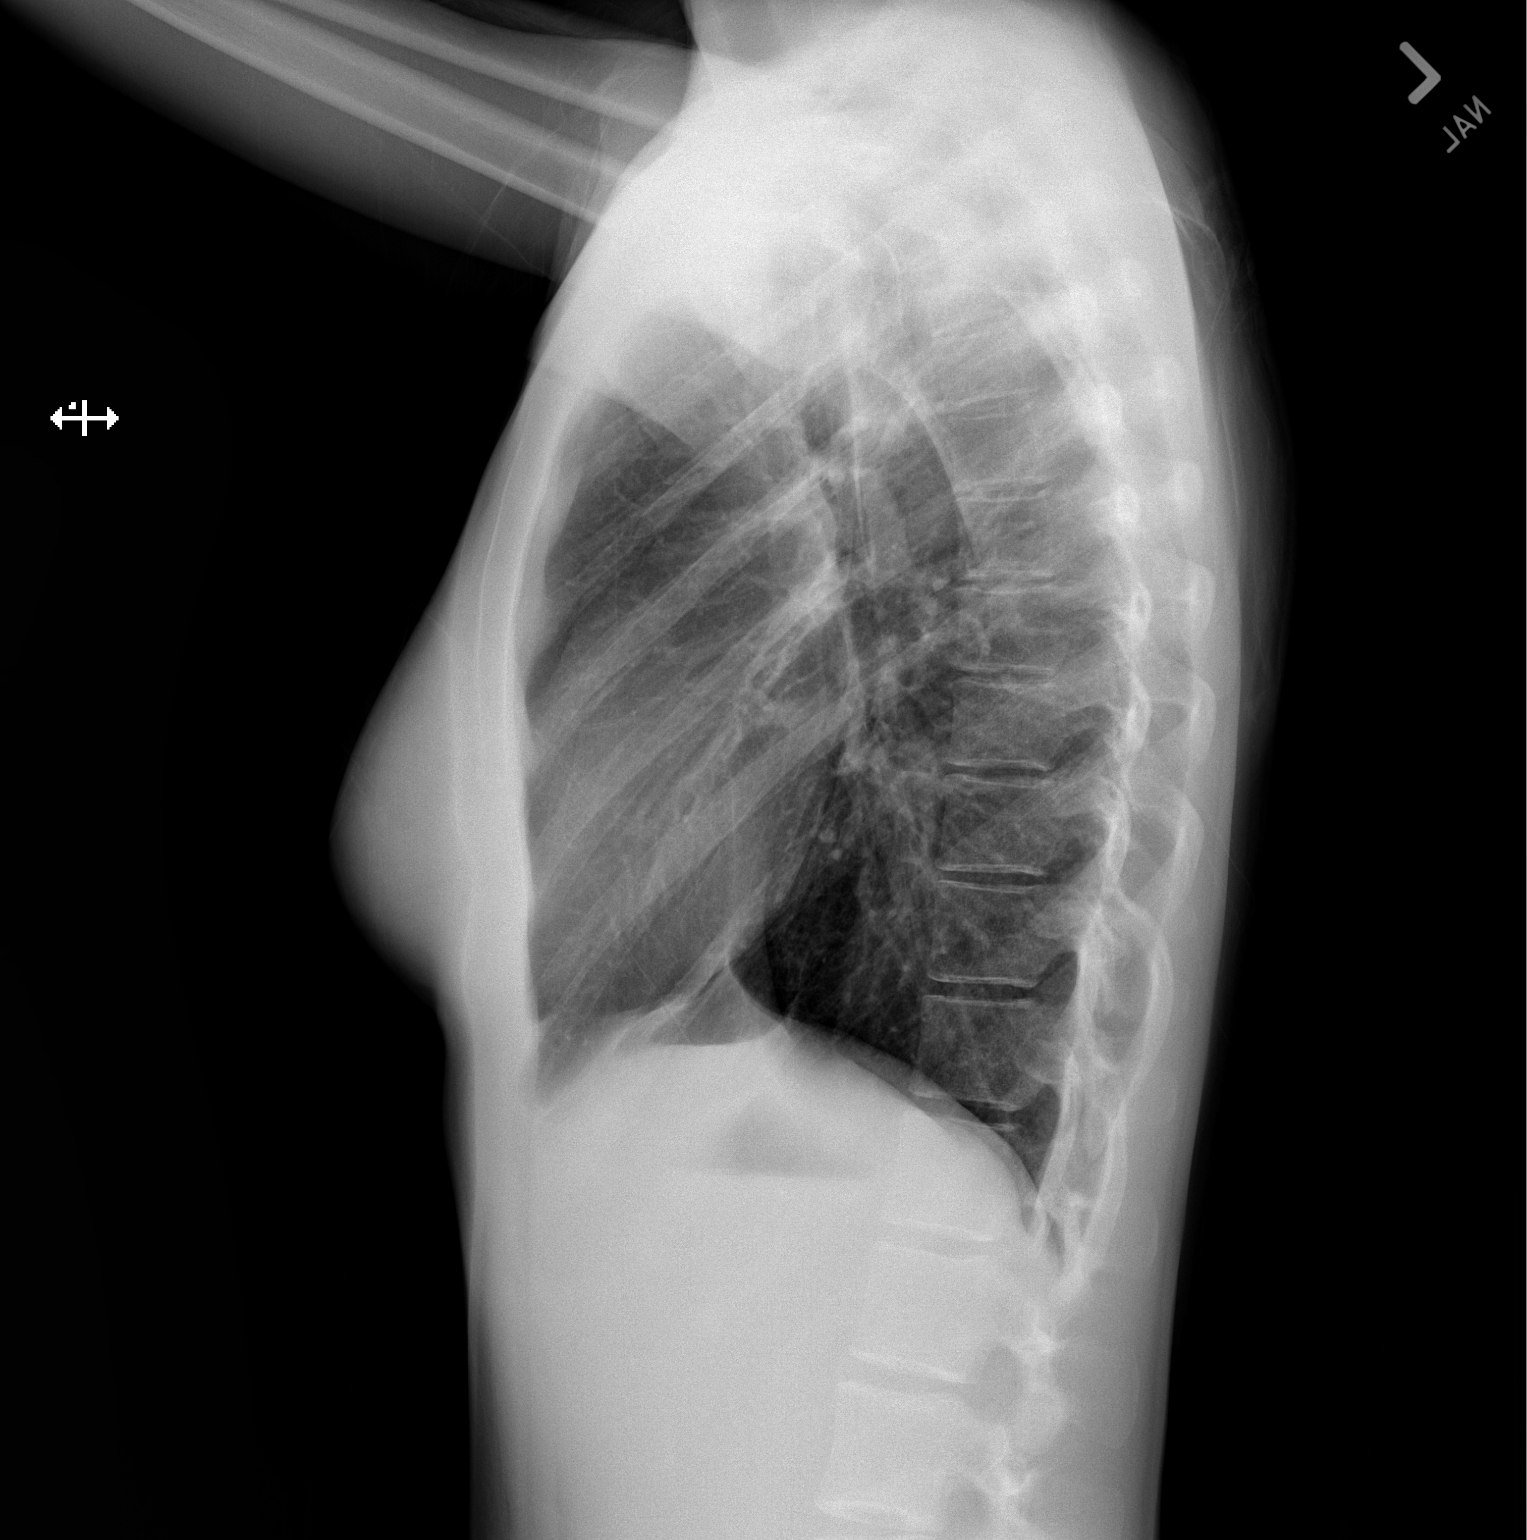
[im 2/2]
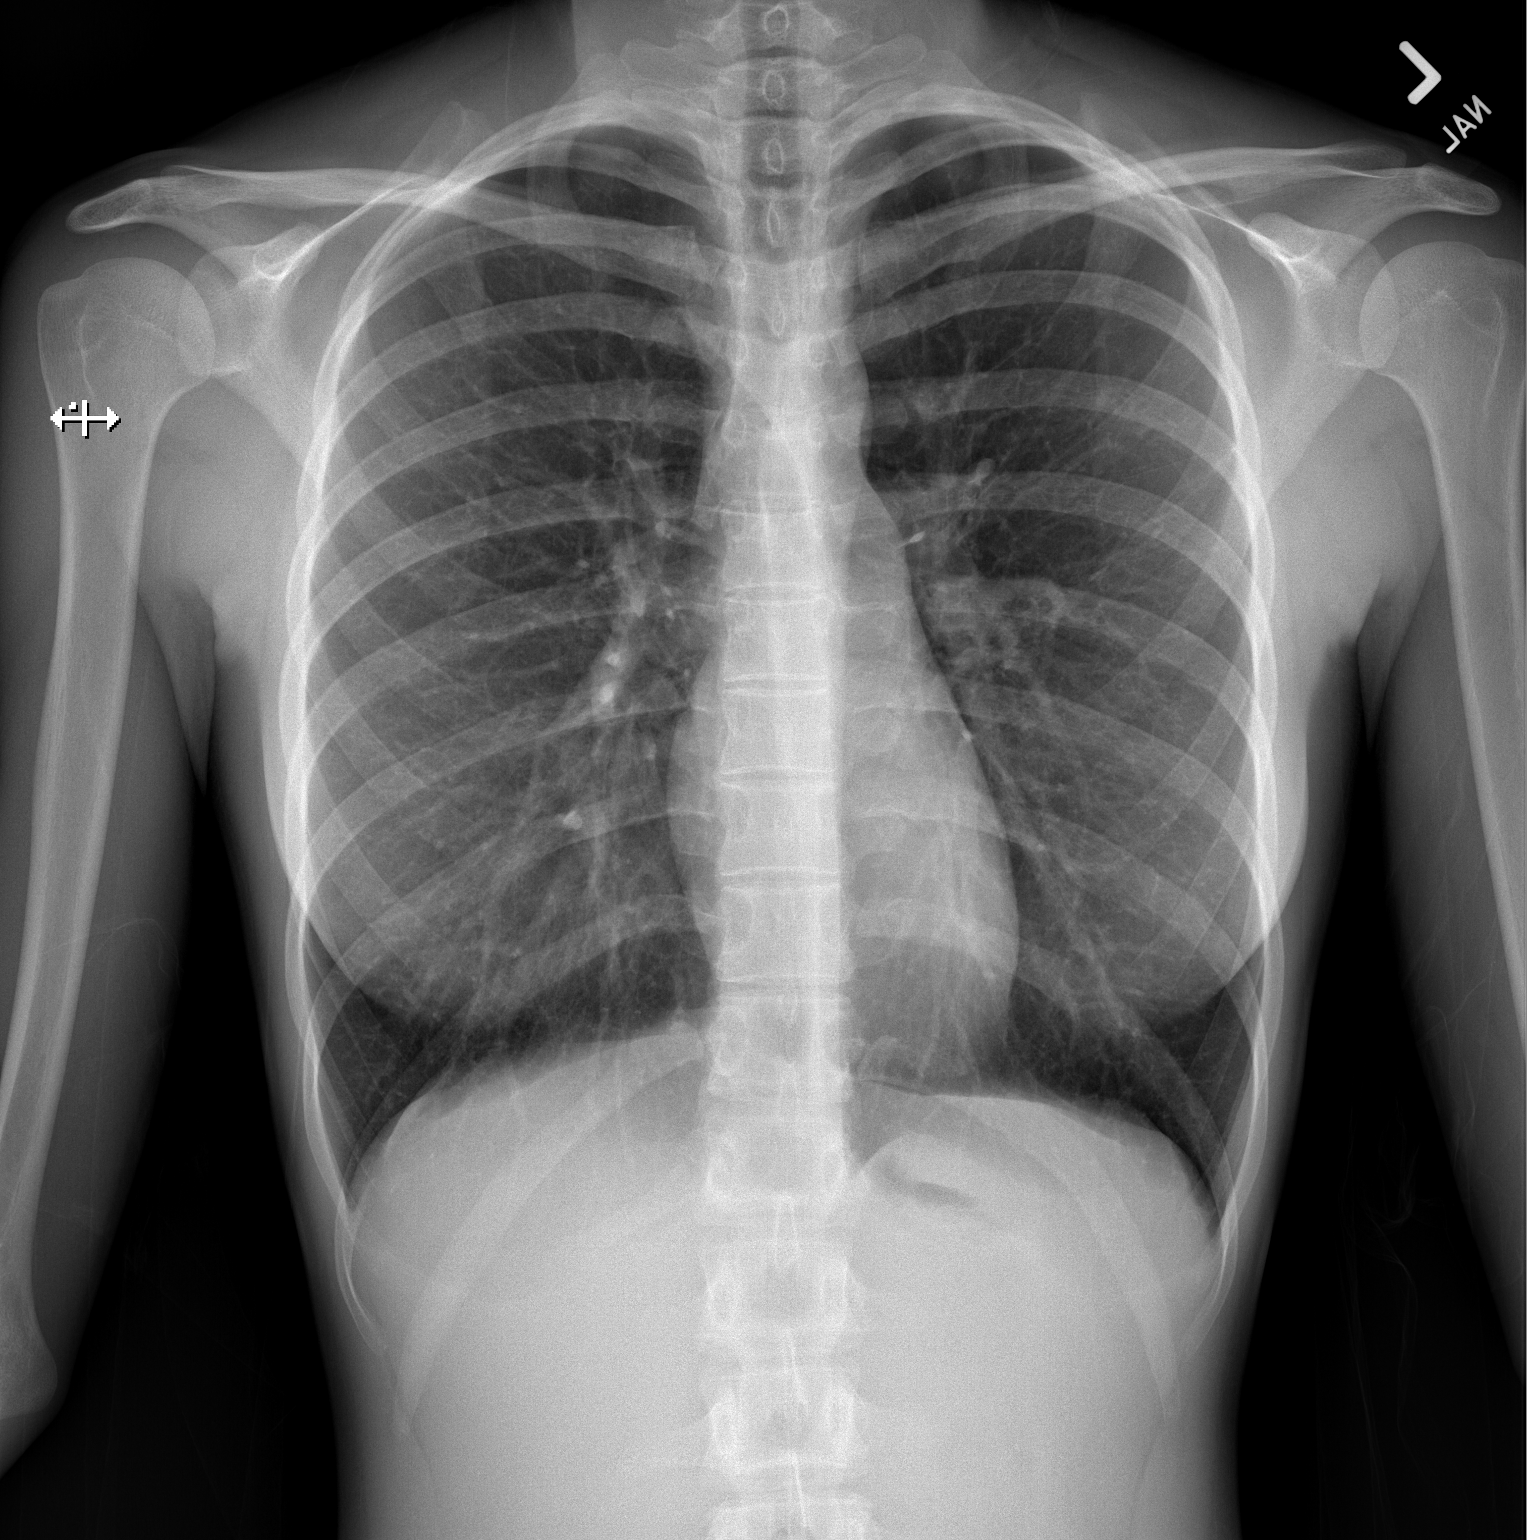

[2 of 2 positions shown; findings below may reference images not displayed]

FINDINGS: Heart size and mediastinal contours are within normal limits. Both
lungs are clear. Visualized skeletal structures are unremarkable.
IMPRESSION: Normal exam.

## 2018-12-28 ENCOUNTER — Telehealth: Payer: BLUE CROSS/BLUE SHIELD | Attending: Neurology

## 2018-12-28 DIAGNOSIS — R569 Unspecified convulsions: Secondary | ICD-10-CM

## 2018-12-28 MED ORDER — LACOSAMIDE 100 MG PO TABS
100 mg | ORAL_TABLET | Freq: Two times a day (BID) | ORAL | 3 refills | Status: AC
Start: 2018-12-28 — End: ?

## 2018-12-28 NOTE — Consults
INITIAL NEUROLOGY OUTPATIENT CONSULTATION    VIDEO VISIT    12/28/2018      REFERRING PROVIDER n/a    Neta Mends PROVIDER: Gildardo Cranker [facility])    REASON FOR CONSULT:  Concern for seizure    Patient Consent to Telehealth Questionnaire   [facility] TELEHEALTH PRECHECKIN QUESTIONS 12/28/2018   By clicking ''I Agree'', I consent to the below:  I Agree     - I agree  to be treated via a video visit and acknowledge that I may be liable for any relevant copays or coinsurance depending on my insurance plan.  - I understand that this video visit is offered for my convenience and I am able to cancel and reschedule for an in-person appointment if I desire.  - I also acknowledge that sensitive medical information may be discussed during this video visit appointment and that it is my responsibility to locate myself in a location that ensures privacy to my own level of comfort.  - I also acknowledge that I should not be participating in a video visit in a way that could cause danger to myself or to those around me (such as driving or walking).  If my provider is concerned about my safety, I understand that they have the right to terminate the visit.           HISTORY OF PRESENT ILLNESS:  28 y.o. RH lady with concern for seizures. She states she had one when [place]. She describes being in the backseat in her seatbelt, felt ''off'' prior to getting into the car and friends described that she was shaking. She was confused immediately and for some time thereafter. She had EMS arrive and went to ER but had no imaging or EEG.   She did bite her tongue this first episode. She was sleep deprived. No etoh but did smoke MJ. Friends described movements as ''jolting''.    Again 2 weeks ago was hanging out in the yard and had just had a glass of wine and had another episode. She felt dizzy prior and sat down, then fell to the ground w LOC, shaking per boyfriend for 15 seconds, then was out of it. She thinks she came back to awareness about 30 seconds after that and asked where she was and she feels her confusion was very brief but she still felt foggy for quite some time thereafter and was very fatigued/somnolent. She took a shower and went to sleep. Denies tongue biting or urination.  Boyfriend describes flexed elbows, clenched fists, rigid neck with jerking movements.  Most recent episode 12/16/2018.    Has hx of fainting episodes as well attriubted prior to cardiac arrhythmia. For these episodes she feels an irregular heartbeat just prior like a fast fluttering she describes, gets a wave of nausea and dizziness and will try to get to the floor given the warning and she comes out of it quickly. She feels these events are distinctly different from the two aforementioned ones concerning for GTC seizures.         REVIEW OF SYSTEMS: A 14 point review of systems was negative per patient inclusive of the following systems: allergy, dermatologic, endocrinologic, HEENT, ophthalmologic, neurologic, psychiatric, gastrointestinal, pulmonary, cardiac, urologic, musculoskeletal, hematologic, and constitutional.       There is no problem list on file for this patient.    Allergies not on file  No outpatient medications prior to visit.     No facility-administered medications  prior to visit.        Social History     Socioeconomic History   ??? Marital status: Unknown     Spouse name: Not on file   ??? Number of children: Not on file   ??? Years of education: Not on file   ??? Highest education level: Not on file   Occupational History   ??? Not on file   Social Needs   ??? Financial resource strain: Not on file   ??? Food insecurity:     Worry: Not on file     Inability: Not on file   ??? Transportation needs:     Medical: Not on file     Non-medical: Not on file   Tobacco Use   ??? Smoking status: Not on file   Substance and Sexual Activity   ??? Alcohol use: Not on file   ??? Drug use: Not on file   ??? Sexual activity: Not on file Lifestyle   ??? Physical activity:     Days per week: Not on file     Minutes per session: Not on file   ??? Stress: Not on file   Relationships   ??? Social connections:     Talks on phone: Not on file     Gets together: Not on file     Attends religious service: Not on file     Active member of club or organization: Not on file     Attends meetings of clubs or organizations: Not on file     Relationship status: Not on file   Other Topics Concern   ??? Not on file   Social History Narrative   ??? Not on file     No family history on file.    There were no vitals filed for this visit.  EXAM  General: No apparent distress, NCAT, Awake  HEENT: anicteric sclera clear oropharynx  Extremities: no clubbing cyanosis or edema    NEURO  Mental Status: AOx4, follows commands,   Fluent language with comprehension, repetition and naming intact    Cranial Nerves: EOMI,face symmetric to eye closure and symmetric smile, palate elevates symmetrically, tongue is midline without atrophy    Motor:  Tremor - none  Fine Motor - intact to fingertaps  Tone - UTA  Bulk - normal  No drift    Reflexes:   UTA    Coordination: intact Finger to Nose    Sensory UTA    Gait:  Good casual gait        IMPRESSION AND RECOMMENDATIONS  28 y.o. 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 Suggest starting vimpat 100 BID and no driving x 3 mo (ok to drive 54/02/8118) if no interval events of concern. Encouraged etoh avoidance and avoidance of sleep deprivation. No swimming/surfing/bathing alone.  Will get Epilepsy protocol MRI and EEG as well.    FU w primary and cardiology re arrhythmia. Has done a holter prior.         Arlet Marter C. Tannie Koskela

## 2019-01-01 DIAGNOSIS — R569 Unspecified convulsions: Secondary | ICD-10-CM

## 2019-01-02 ENCOUNTER — Inpatient Hospital Stay: Payer: BLUE CROSS/BLUE SHIELD | Attending: Neurology

## 2019-01-09 ENCOUNTER — Inpatient Hospital Stay: Payer: BLUE CROSS/BLUE SHIELD | Attending: Neurology

## 2019-01-09 DIAGNOSIS — R569 Unspecified convulsions: Secondary | ICD-10-CM
